# Patient Record
Sex: Female | Born: 1980 | Race: White | Hispanic: No | Marital: Married | Smoking: Former smoker
Health system: Southern US, Community
[De-identification: ages and names within clinical notes are randomized; demographics above are authoritative.]

## PROBLEM LIST (undated history)

## (undated) HISTORY — PX: WISDOM TOOTH EXTRACTION: SHX21

---

## 2018-09-29 ENCOUNTER — Emergency Department (HOSPITAL_COMMUNITY): Payer: Commercial Managed Care - PPO

## 2018-09-29 ENCOUNTER — Encounter (HOSPITAL_COMMUNITY): Payer: Self-pay

## 2018-09-29 ENCOUNTER — Emergency Department (HOSPITAL_COMMUNITY)
Admission: EM | Admit: 2018-09-29 | Discharge: 2018-09-29 | Disposition: A | Discharge status: Home / Self Care | Payer: Commercial Managed Care - PPO | Attending: Emergency Medicine | Admitting: Emergency Medicine

## 2018-09-29 DIAGNOSIS — W19XXXA Unspecified fall, initial encounter: Secondary | ICD-10-CM | POA: Diagnosis not present

## 2018-09-29 DIAGNOSIS — Y939 Activity, unspecified: Secondary | ICD-10-CM | POA: Insufficient documentation

## 2018-09-29 DIAGNOSIS — M25531 Pain in right wrist: Secondary | ICD-10-CM

## 2018-09-29 DIAGNOSIS — Y929 Unspecified place or not applicable: Secondary | ICD-10-CM | POA: Diagnosis not present

## 2018-09-29 DIAGNOSIS — Y999 Unspecified external cause status: Secondary | ICD-10-CM | POA: Diagnosis not present

## 2018-09-29 DIAGNOSIS — Z87891 Personal history of nicotine dependence: Secondary | ICD-10-CM | POA: Diagnosis not present

## 2018-09-29 DIAGNOSIS — S52121A Displaced fracture of head of right radius, initial encounter for closed fracture: Secondary | ICD-10-CM | POA: Insufficient documentation

## 2018-09-29 MED ORDER — OXYCODONE-ACETAMINOPHEN 5-325 MG PO TABS: 1.0000 | ORAL_TABLET | Freq: Four times a day (QID) | ORAL | 0 refills | Status: AC | PRN

## 2018-09-29 MED ORDER — ACETAMINOPHEN 325 MG PO TABS
650.0000 mg | ORAL_TABLET | Freq: Once | ORAL | Status: AC
  Administered 2018-09-29: 650 mg via ORAL
  Filled 2018-09-29: qty 2

## 2018-09-29 NOTE — ED Triage Notes (Signed)
Pt presents with deformity to R forearm after falling down 4 stairs, landing on outstretched arm.  - LOC, last ate last night at 2100.

## 2018-09-29 NOTE — ED Provider Notes (Signed)
[facility] EMERGENCY DEPARTMENT Provider Note   CSN: [id number] Arrival date & time: 09/29/18  1141    History   Chief Complaint Chief Complaint  Patient presents with  . Fall    HPI Debra Valencia is a 38 y.o. female.     HPI   38 year old female present status post fall.  Patient notes she had a mechanical fall landing forward on outstretched hand.  She notes pain to the distal radius proximal hand and elbow.  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  No medications prior to arrival.  No other injuries to note.  History reviewed. No pertinent past medical history.  There are no active problems to display for this patient.   Past Surgical History:  Procedure Laterality Date  . WISDOM TOOTH EXTRACTION       OB History   No obstetric history on file.      Home Medications    Prior to Admission medications   Medication Sig Start Date End Date Taking? Authorizing Provider  oxyCODONE-acetaminophen (PERCOCET/ROXICET) 5-325 MG tablet Take 1 tablet by mouth every 6 (six) hours as needed for severe pain. 09/29/18   Eyvonne Mechanic, PA-C    Family History History reviewed. No pertinent family history.  Social History Social History   Tobacco Use  . Smoking status: Former Games developer  . Smokeless tobacco: Never Used  Substance Use Topics  . Alcohol use: Not on file  . Drug use: Not on file     Allergies   Hydrocodone   Review of Systems Review of Systems  All other systems reviewed and are negative.  Physical Exam Updated Vital Signs BP 133/88 (BP Location: Left Arm)   Pulse 71   Temp 98 F (36.7 C) (Oral)   Resp 20   Ht 5\' 4"  (1.626 m)   Wt 70.3 kg   LMP 09/05/2018 (Approximate)   SpO2 98%   BMI 26.61 kg/m   Physical Exam Vitals signs and nursing note reviewed.  Constitutional:      Appearance: She is well-developed.  HENT:     Head: Normocephalic and atraumatic.  Eyes:   General: No scleral icterus.       Right eye: No discharge.        Left eye: No discharge.     Conjunctiva/sclera: Conjunctivae normal.     Pupils: Pupils are equal, round, and reactive to light.  Neck:     Musculoskeletal: Normal range of motion.     Vascular: No JVD.     Trachea: No tracheal deviation.  Pulmonary:     Effort: Pulmonary effort is normal.     Breath sounds: No stridor.  Musculoskeletal:     Comments: Swelling noted at the distal radius and wrist, tenderness palpation at the distal wrist proximal thumb and scaphoid-moderate tenderness generally to the elbow -sensory and motor function intact at the hand and fingers-radial pulse 2+  Neurological:     Mental Status: She is alert and oriented to person, place, and time.     Coordination: Coordination normal.  Psychiatric:        Behavior: Behavior normal.        Thought Content: Thought content normal.        Judgment: Judgment normal.     ED Treatments / Results  Labs (all labs ordered are listed, but only abnormal results are displayed) Labs Reviewed - No data to display  EKG None  Radiology Dg Elbow Complete  Right  Result Date: 09/29/2018 CLINICAL DATA:  Right elbow pain after fall down steps. EXAM: RIGHT ELBOW - COMPLETE 3+ VIEW COMPARISON:  None. FINDINGS: Anterior fat pad displacement is noted suggesting underlying joint effusion. There appears to be a mildly displaced fracture involving the radial head. The visualized humerus and ulna are unremarkable. IMPRESSION: Mildly displaced radial head fracture is noted. Electronically Signed   By: Lupita Raider, M.D.   On: 09/29/2018 13:24   Dg Wrist Complete Right  Result Date: 09/29/2018 CLINICAL DATA:  Right wrist pain after fall down steps. EXAM: RIGHT WRIST - COMPLETE 3+ VIEW COMPARISON:  None. FINDINGS: There is no evidence of fracture or dislocation. There is no evidence of arthropathy or other focal bone abnormality. Soft tissues are unremarkable.  IMPRESSION: Negative. Electronically Signed   By: Lupita Raider, M.D.   On: 09/29/2018 13:20   Dg Hand Complete Right  Result Date: 09/29/2018 CLINICAL DATA:  Right hand pain after fall down steps. EXAM: RIGHT HAND - COMPLETE 3+ VIEW COMPARISON:  None. FINDINGS: There is no evidence of fracture or dislocation. There is no evidence of arthropathy or other focal bone abnormality. Soft tissues are unremarkable. IMPRESSION: Negative. Electronically Signed   By: Lupita Raider, M.D.   On: 09/29/2018 13:22    Procedures Procedures (including critical care time)  Medications Ordered in ED Medications  acetaminophen (TYLENOL) tablet 650 mg (650 mg Oral Given 09/29/18 1332)     Initial Impression / Assessment and Plan / ED Course  I have reviewed the triage vital signs and the nursing notes.  Pertinent labs & imaging results that were available during my care of the patient were reviewed by me and considered in my medical decision making (see chart for details).        Labs:   Imaging: DG elbow, DG wrist, DG hand  Consults:  Therapeutics: Tylenol  Discharge Meds: Percocet  Assessment/Plan: 38 year old female presents status post fall.  Patient with elbow pain and wrist pain.  She has an acute radial head fracture.  I did not attempt to range her elbow but she notes she is unable to extend it secondary to discomfort.  Patient does have snuffbox tenderness.  She had posterior splint and thumb spica applied, she will follow-up closely with outpatient orthopedic specialist and return immediately if she develops any new or worsening signs or symptoms.  She has no neurological deficits.  Discharge with return precautions.   Final Clinical Impressions(s) / ED Diagnoses   Final diagnoses:  Closed displaced fracture of head of right radius, initial encounter  Right wrist pain    ED Discharge Orders         Ordered    oxyCODONE-acetaminophen (PERCOCET/ROXICET) 5-325 MG tablet  Every 6  hours PRN     09/29/18 1508           Rosalio Loud 09/29/18 1556    Azalia Bilis, MD 09/29/18 1726

## 2018-09-29 NOTE — Progress Notes (Signed)
Orthopedic Tech Progress Note Patient Details:  Debra Valencia [birth year] [id number]  Ortho Devices Type of Ortho Device: Post (long arm) splint, Thumb spica splint Splint Material: Fiberglass Ortho Device/Splint Location: rue Ortho Device/Splint Interventions: Ordered, Application, Adjustment   Post Interventions Patient Tolerated: Well Instructions Provided: Care of device, Adjustment of device   Trinna Post 09/29/2018, 3:37 PM

## 2018-09-29 NOTE — ED Notes (Signed)
Pain medication offered, pt declines at this time.

## 2018-09-29 NOTE — Discharge Instructions (Addendum)
Please read attached information. If you experience any new or worsening signs or symptoms please return to the emergency room for evaluation. Please follow-up with your primary care provider or specialist as discussed. Please use medication prescribed only as directed and discontinue taking if you have any concerning signs or symptoms.   °

## 2018-09-30 ENCOUNTER — Other Ambulatory Visit: Payer: Self-pay | Admitting: Orthopedic Surgery

## 2018-09-30 DIAGNOSIS — M25521 Pain in right elbow: Secondary | ICD-10-CM

## 2018-10-03 ENCOUNTER — Ambulatory Visit
Admission: RE | Admit: 2018-10-03 | Discharge: 2018-10-03 | Disposition: A | Discharge status: Home / Self Care | Payer: Commercial Managed Care - PPO | Source: Ambulatory Visit | Attending: Orthopedic Surgery | Admitting: Orthopedic Surgery

## 2018-10-03 ENCOUNTER — Other Ambulatory Visit: Payer: Self-pay | Admitting: Orthopedic Surgery

## 2018-10-03 DIAGNOSIS — M25521 Pain in right elbow: Secondary | ICD-10-CM

## 2018-10-04 ENCOUNTER — Other Ambulatory Visit: Payer: Commercial Managed Care - PPO

## 2019-07-15 IMAGING — CT CT ELBOW*R* W/O CM
3 series · 8 of 14 positions shown, 9 images · non-contrast
Comparison: 09/29/2018 radiographs

CLINICAL DATA: Right elbow pain

EXAM:
CT OF THE LOWER RIGHT EXTREMITY WITHOUT CONTRAST
TECHNIQUE: Multidetector CT imaging of the right lower extremity was performed
according to the standard protocol.

[Series 3: ext soft · axial · 0.30mm/px · z∈[-0,+102]mm · 4 of 86 slices shown]
[im 18/86  soft-tissue]
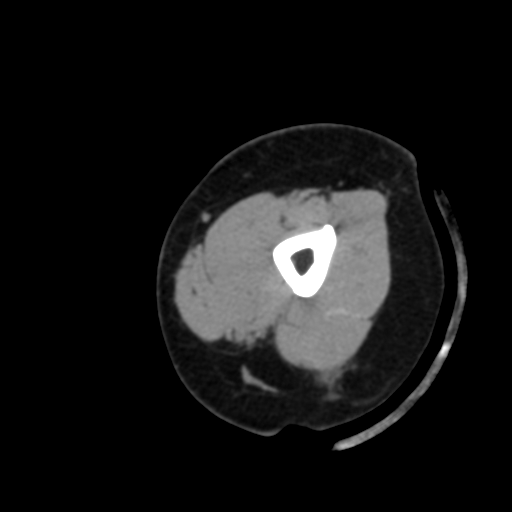
[im 35/86  soft-tissue]
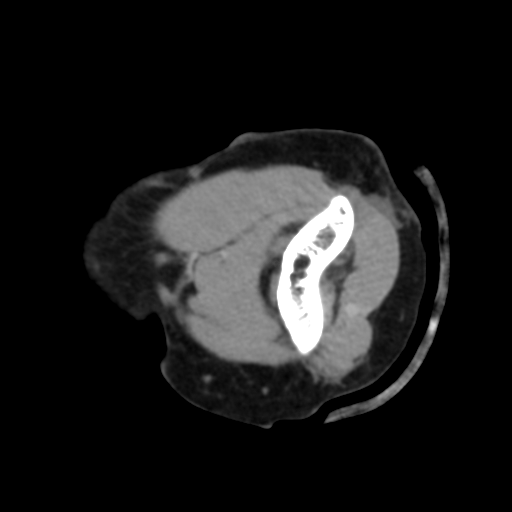
[im 52/86  soft-tissue]
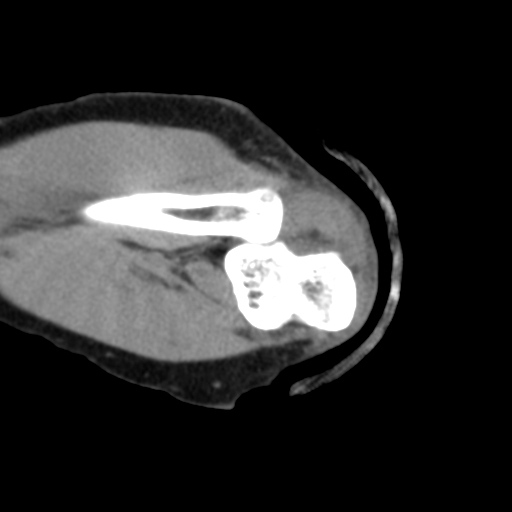
[im 69/86  soft-tissue]
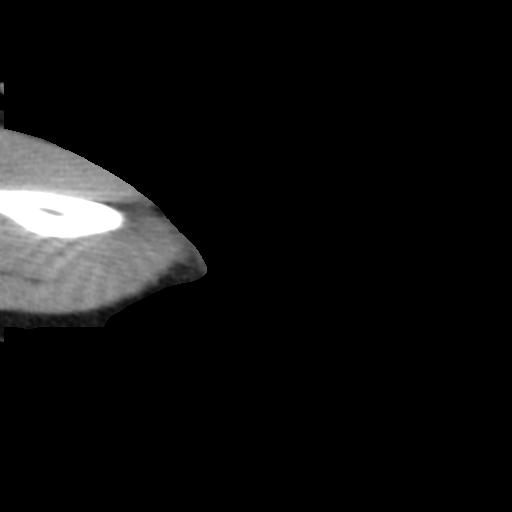

[Series 11: cor · axial · 0.32mm/px · z∈[+5,+33]mm · 2 of 48 slices shown, 3 images]
[im 16/48  soft-tissue]
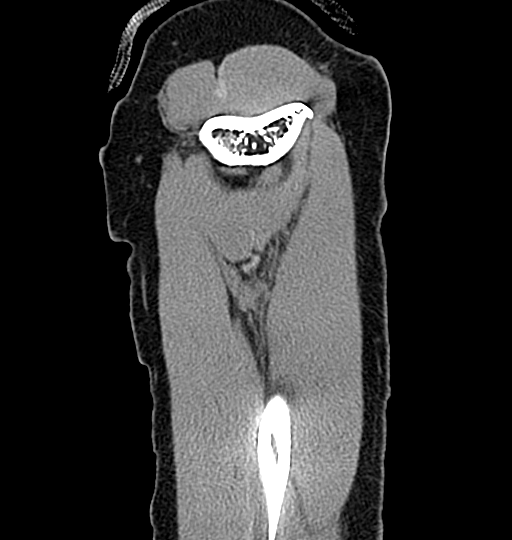
[im 16/48  bone]
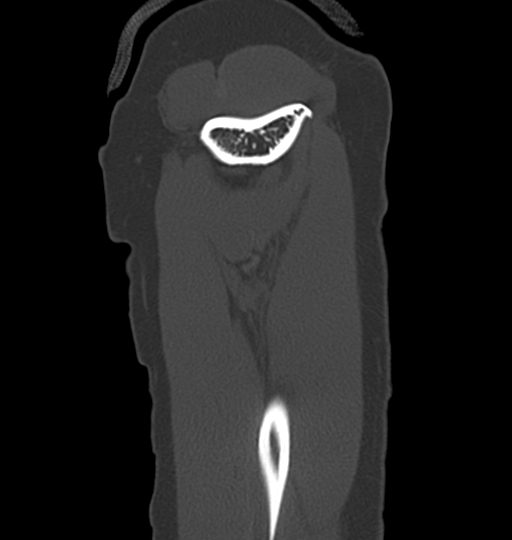
[im 32/48  bone]
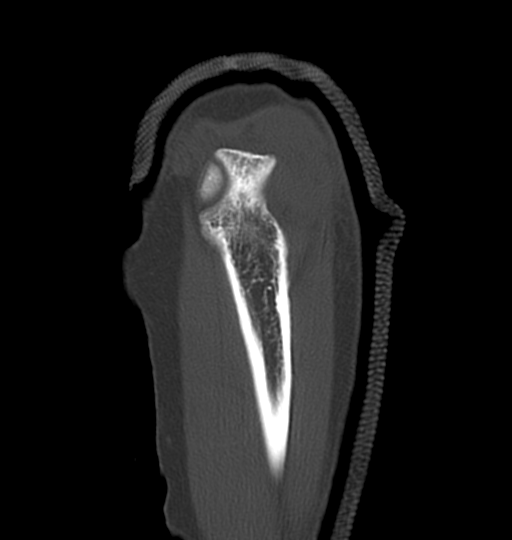

[Series 12: cor soft · axial · 0.26mm/px · z∈[-26,-1]mm · 2 of 47 slices shown]
[im 16/47  soft-tissue]
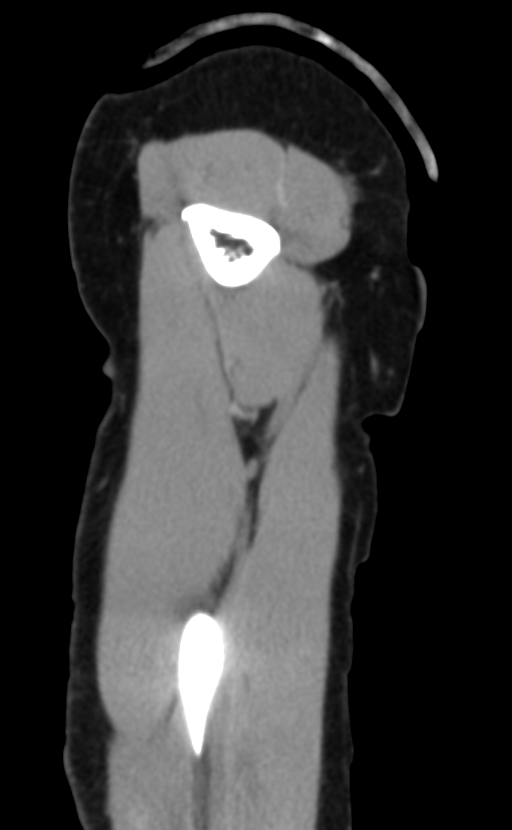
[im 31/47  soft-tissue]
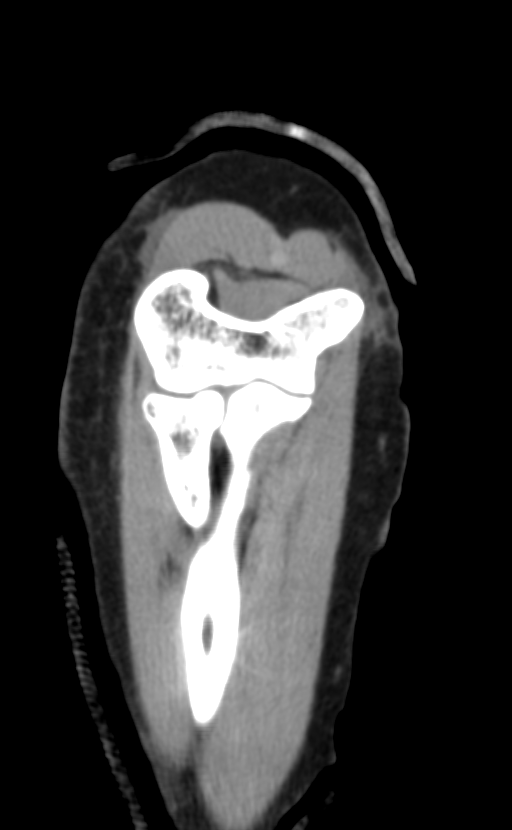

[8 of 14 positions shown; findings below may reference images not displayed]

FINDINGS: Bones/Joint/Cartilage

Oblique fracture of the medial third of the radial head with up to 1
mm of step-off at the fracture site as shown for example on image
[DATE]. Less than 1 mm distraction. No ulnar fracture or fracture of
the distal humerus observed. There is a moderate joint effusion as
on image 32/8.

Ligaments

Suboptimally assessed by CT.

Muscles and Tendons

Unremarkable

Soft tissues

Mild subcutaneous edema along the posterolateral elbow and posterior
to the olecranon.
IMPRESSION: 1. Acute radial head fracture with 1 mm of step-off and less than 1
mm of distraction at the fracture site. The fracture involves
primarily the medial third of the radial head as shown for example
on image [DATE].
2. Elbow joint effusion noted.
3. Mild posterior and posterolateral subcutaneous edema.
